# Patient Record
Sex: Female | Born: 1960
Health system: Southern US, Community
[De-identification: ages and names within clinical notes are randomized; demographics above are authoritative.]

## PROBLEM LIST (undated history)

## (undated) DIAGNOSIS — Z8742 Personal history of other diseases of the female genital tract: Secondary | ICD-10-CM

## (undated) DIAGNOSIS — D649 Anemia, unspecified: Secondary | ICD-10-CM

## (undated) HISTORY — DX: Personal history of other diseases of the female genital tract: Z87.42

## (undated) HISTORY — DX: Anemia, unspecified: D64.9

## (undated) HISTORY — PX: WISDOM TOOTH EXTRACTION: SHX21

## (undated) HISTORY — PX: TUBAL LIGATION: SHX77

---

## 1999-01-08 ENCOUNTER — Other Ambulatory Visit: Admission: RE | Admit: 1999-01-08 | Discharge: 1999-01-08 | Payer: Self-pay | Admitting: Family Medicine

## 2000-04-24 ENCOUNTER — Other Ambulatory Visit: Admission: RE | Admit: 2000-04-24 | Discharge: 2000-04-24 | Payer: Self-pay | Admitting: Family Medicine

## 2001-05-01 ENCOUNTER — Other Ambulatory Visit: Admission: RE | Admit: 2001-05-01 | Discharge: 2001-05-01 | Payer: Self-pay | Admitting: Family Medicine

## 2001-05-04 ENCOUNTER — Encounter: Payer: Self-pay | Admitting: Family Medicine

## 2001-05-04 ENCOUNTER — Encounter: Admission: RE | Admit: 2001-05-04 | Discharge: 2001-05-04 | Payer: Self-pay | Admitting: Family Medicine

## 2001-05-08 ENCOUNTER — Encounter: Payer: Self-pay | Admitting: Family Medicine

## 2001-05-08 ENCOUNTER — Encounter: Admission: RE | Admit: 2001-05-08 | Discharge: 2001-05-08 | Payer: Self-pay | Admitting: Family Medicine

## 2002-06-16 ENCOUNTER — Other Ambulatory Visit: Admission: RE | Admit: 2002-06-16 | Discharge: 2002-06-16 | Payer: Self-pay | Admitting: Family Medicine

## 2002-06-29 ENCOUNTER — Encounter: Payer: Self-pay | Admitting: Family Medicine

## 2002-06-29 ENCOUNTER — Encounter: Admission: RE | Admit: 2002-06-29 | Discharge: 2002-06-29 | Payer: Self-pay | Admitting: Family Medicine

## 2002-07-05 ENCOUNTER — Encounter: Payer: Self-pay | Admitting: Gastroenterology

## 2002-07-05 ENCOUNTER — Ambulatory Visit (HOSPITAL_COMMUNITY): Admission: RE | Admit: 2002-07-05 | Discharge: 2002-07-05 | Payer: Self-pay | Admitting: Gastroenterology

## 2003-06-29 ENCOUNTER — Other Ambulatory Visit: Admission: RE | Admit: 2003-06-29 | Discharge: 2003-06-29 | Payer: Self-pay | Admitting: Obstetrics and Gynecology

## 2004-07-02 ENCOUNTER — Other Ambulatory Visit: Admission: RE | Admit: 2004-07-02 | Discharge: 2004-07-02 | Payer: Self-pay | Admitting: Obstetrics and Gynecology

## 2005-07-02 ENCOUNTER — Other Ambulatory Visit: Admission: RE | Admit: 2005-07-02 | Discharge: 2005-07-02 | Payer: Self-pay | Admitting: Obstetrics and Gynecology

## 2008-09-16 ENCOUNTER — Encounter: Admission: RE | Admit: 2008-09-16 | Discharge: 2008-09-16 | Payer: Self-pay | Admitting: Family Medicine

## 2011-07-16 HISTORY — PX: COLONOSCOPY: SHX174

## 2012-08-24 ENCOUNTER — Encounter: Payer: Self-pay | Admitting: Obstetrics and Gynecology

## 2012-08-24 ENCOUNTER — Ambulatory Visit: Payer: 59 | Admitting: Obstetrics and Gynecology

## 2012-08-24 VITALS — BP 108/70 | Ht 60.5 in | Wt 154.0 lb

## 2012-08-24 DIAGNOSIS — Z01419 Encounter for gynecological examination (general) (routine) without abnormal findings: Secondary | ICD-10-CM

## 2012-08-24 DIAGNOSIS — Z124 Encounter for screening for malignant neoplasm of cervix: Secondary | ICD-10-CM

## 2012-08-24 NOTE — Progress Notes (Signed)
Subjective:    Mandy Sanders is a 52 y.o. female, G2P0, who presents for an annual exam. The patient reports  Menstrual cycle:   LMP: No LMP recorded. Patient is postmenopausal.             Review of Systems Pertinent items are noted in HPI. Denies pelvic pain, urinary tract symptoms, vaginitis symptoms, irregular bleeding, menopausal symptoms, change in bowel habits or rectal bleeding   Objective:    BP 108/70  Ht 5' 0.5" (1.537 m)  Wt 154 lb (69.854 kg)  BMI 29.57 kg/m2   Wt Readings from Last 1 Encounters:  08/24/12 154 lb (69.854 kg)   Body mass index is 29.57 kg/(m^2). General Appearance: Alert, no acute distress HEENT: Grossly normal Neck / Thyroid: Supple, no thyromegaly or cervical adenopathy Lungs: Clear to auscultation bilaterally Back: No CVA tenderness Breast Exam: No masses or nodes.No dimpling, nipple retraction or discharge. Cardiovascular: Regular rate and rhythm.  Gastrointestinal: Soft, non-tender, no masses or organomegaly Pelvic Exam: EGBUS-wnl, vagina-normal rugae, cervix- without lesions or tenderness, uterus appears normal size shape and consistency, adnexae-no masses or tenderness Rectovaginal: no masses and normal sphincter tone Lymphatic Exam: Non-palpable nodes in neck, clavicular,  axillary, or inguinal regions  Skin: no rashes or abnormalities Extremities: no clubbing cyanosis or edema  Neurologic: grossly normal Psychiatric: Alert and oriented     Assessment:   Routine GYN Exam   Plan:    PAP sent  RTO 1 year or prn  Angelette Ganus,ELMIRAPA-C

## 2012-08-24 NOTE — Progress Notes (Signed)
Regular Periods: no Mammogram: yes  Monthly Breast Ex.: yes Exercise: yes  Tetanus < 10 years: yes Seatbelts: yes  NI. Bladder Functn.: yes Abuse at home: no  Daily BM's: yes Stressful Work: no  Healthy Diet: yes Sigmoid-Colonoscopy: 2013  Calcium: yes Medical problems this year: none   LAST PAP:1/13  Contraception: btl  Mammogram:  1/13   nl  PCP: Altamease Oiler Redmon  PMH: no change  FMH: no change  Last Bone Scan: no  Pt is married.

## 2013-04-30 ENCOUNTER — Telehealth (HOSPITAL_COMMUNITY): Payer: Self-pay | Admitting: Family Medicine

## 2013-04-30 ENCOUNTER — Emergency Department (HOSPITAL_COMMUNITY)
Admission: EM | Admit: 2013-04-30 | Discharge: 2013-04-30 | Disposition: A | Discharge status: Home / Self Care | Payer: 59 | Source: Home / Self Care | Attending: Family Medicine | Admitting: Family Medicine

## 2013-04-30 ENCOUNTER — Ambulatory Visit (HOSPITAL_COMMUNITY)
Admit: 2013-04-30 | Discharge: 2013-04-30 | Disposition: A | Discharge status: Home / Self Care | Payer: 59 | Attending: Family Medicine | Admitting: Family Medicine

## 2013-04-30 ENCOUNTER — Encounter (HOSPITAL_COMMUNITY): Payer: Self-pay | Admitting: Emergency Medicine

## 2013-04-30 ENCOUNTER — Emergency Department (INDEPENDENT_AMBULATORY_CARE_PROVIDER_SITE_OTHER): Payer: 59

## 2013-04-30 DIAGNOSIS — M542 Cervicalgia: Secondary | ICD-10-CM | POA: Insufficient documentation

## 2013-04-30 DIAGNOSIS — M5412 Radiculopathy, cervical region: Secondary | ICD-10-CM

## 2013-04-30 DIAGNOSIS — M5030 Other cervical disc degeneration, unspecified cervical region: Secondary | ICD-10-CM

## 2013-04-30 DIAGNOSIS — M5380 Other specified dorsopathies, site unspecified: Secondary | ICD-10-CM | POA: Insufficient documentation

## 2013-04-30 MED ORDER — ONDANSETRON 4 MG PO TBDP
4.0000 mg | ORAL_TABLET | Freq: Once | ORAL | Status: AC
  Administered 2013-04-30: 4 mg via ORAL

## 2013-04-30 MED ORDER — OXYCODONE-ACETAMINOPHEN 5-325 MG PO TABS: 1.0000 | ORAL_TABLET | ORAL | Status: DC | PRN

## 2013-04-30 MED ORDER — PREDNISONE 20 MG PO TABS
ORAL_TABLET | ORAL | Status: AC
  Filled 2013-04-30: qty 3

## 2013-04-30 MED ORDER — ONDANSETRON 4 MG PO TBDP
ORAL_TABLET | ORAL | Status: AC
  Filled 2013-04-30: qty 1

## 2013-04-30 MED ORDER — PREDNISONE 20 MG PO TABS
60.0000 mg | ORAL_TABLET | Freq: Once | ORAL | Status: AC
  Administered 2013-04-30: 60 mg via ORAL

## 2013-04-30 MED ORDER — HYDROMORPHONE HCL PF 1 MG/ML IJ SOLN
INTRAMUSCULAR | Status: AC
  Filled 2013-04-30: qty 2

## 2013-04-30 MED ORDER — HYDROMORPHONE HCL 1 MG/ML IJ SOLN
2.0000 mg | Freq: Once | INTRAMUSCULAR | Status: AC
  Administered 2013-04-30: 2 mg via INTRAMUSCULAR

## 2013-04-30 MED ORDER — HYDROCODONE-ACETAMINOPHEN 5-325 MG PO TABS
1.0000 | ORAL_TABLET | Freq: Once | ORAL | Status: AC
  Administered 2013-04-30: 1 via ORAL

## 2013-04-30 MED ORDER — PREDNISONE 20 MG PO TABS: 40.0000 mg | ORAL_TABLET | Freq: Every day | ORAL | Status: DC

## 2013-04-30 MED ORDER — HYDROCODONE-ACETAMINOPHEN 5-325 MG PO TABS
ORAL_TABLET | ORAL | Status: AC
  Filled 2013-04-30: qty 1

## 2013-04-30 MED ORDER — HYDROMORPHONE HCL 1 MG/ML IJ SOLN: 1.0000 mg | Freq: Once | INTRAMUSCULAR | Status: DC

## 2013-04-30 NOTE — ED Notes (Signed)
Pt       Reports  Pain  r  Arm           X  1  Month             denys  Any  specefic  Injury  Pain  Became  Worse  Last  Pm        When  She  Arrived       She  Was  Noted  To  Be  Wearing an ace  Bandage           Family  With  Her  She  denya  ny  Chest pain  Or  Shortness  Of  Breath

## 2013-04-30 NOTE — ED Notes (Signed)
MRI results back.  I discussed the case with Dr. Newell Coral  Showing rt C6 nerve root compression and slight cord compression.  Will followup with Dr. Newell Coral Tuesday. Continue prednisone Come back or go to the emergency room if you notice worsening  weakness new numbness problems walking or bowel or bladder problems.   [name], MD 04/30/13 2011

## 2013-04-30 NOTE — ED Provider Notes (Signed)
CSN: [id number]     Arrival date & time 04/30/13  0818 History   First MD Initiated Contact with Patient 04/30/13 [phone]     No chief complaint on file.  HPI: Patient is a 52 y.o. female presenting with extremity weakness. The history is provided by the patient.  Extremity Weakness This is a new problem. The current episode started 12 to 24 hours ago. The problem occurs constantly. The problem has been gradually worsening. Nothing aggravates the symptoms. She has tried nothing for the symptoms.  Pt reports an approx 1 month h/o increasing pain to (R) lateral neck that radiates into (R) shoulder and (R) arm. She admits to intermittent numbness and tingling in her (R) hand. Also admits she has noted gradual increasing weakness to the RUE as well. Pt presents to day because over last 24 hours she has noted acutely increased pain (>10/10) to (R) arm and increased weakness to RUE. 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   Past Medical History  Diagnosis Date  . Anemia   . H/O menorrhagia    Past Surgical History  Procedure Laterality Date  . Tubal ligation    . Wisdom tooth extraction    . Colonoscopy  2013   Family History  Problem Relation Age of Onset  . Hypertension Mother   . Diabetes Mother   . Stroke Mother   . Diabetes Father   . Stroke Father   . Hypertension Sister   . Cancer Maternal Grandmother     ovarian?   History  Substance Use Topics  . Smoking status: Current Every Day Smoker  . Smokeless tobacco: Not on file     Comment: 5-7 cigarettes a day  . Alcohol Use: No   OB History   Grav Para Term Preterm Abortions TAB SAB Ect Mult Living   2         2     Review of Systems  Musculoskeletal: Positive for extremity weakness.  All other systems reviewed and are negative.    Allergies  Review of patient's allergies indicates no known allergies.  Home Medications   Current Outpatient Rx  Name  Route  Sig   Dispense  Refill  . calcium carbonate 200 MG capsule   Oral   Take 250 mg by mouth 2 (two) times daily with a meal.         . fexofenadine (ALLEGRA) 180 MG tablet   Oral   Take 180 mg by mouth daily.         . Multiple Vitamin (MULTIVITAMIN) tablet   Oral   Take 1 tablet by mouth daily.         Marland Kitchen oxyCODONE-acetaminophen (PERCOCET/ROXICET) 5-325 MG per tablet   Oral   Take 1-2 tablets by mouth every 4 (four) hours as needed for pain.   20 tablet   0   . predniSONE (DELTASONE) 20 MG tablet   Oral   Take 2 tablets (40 mg total) by mouth daily. For 7 days   14 tablet   0   . RABEprazole (ACIPHEX) 20 MG tablet   Oral   Take 20 mg by mouth daily.          BP 148/79  Pulse 81  Temp(Src) 98 F (36.7 C) (Oral)  Resp 16  SpO2 99% Physical Exam  Constitutional: She is oriented to person, place, and time. She appears well-developed and well-nourished.  HENT:  Head: Normocephalic and atraumatic.  Eyes: Conjunctivae are normal.  Neck: Neck supple. Muscular tenderness present. No spinous process tenderness present. No rigidity.    Significant TTP to (R) lateral paraspinal region that extends (R) shoulder  Cardiovascular: Normal rate.   Pulmonary/Chest: Effort normal.  Musculoskeletal:  Pt reports difficulty raising (R) arm above her head but reports some relief of pain when arm is in that position. Difficult to raise arm laterally. Strength and grip weaker on (R) vs (L). RUE strength 3/5 vs 5/5 on (L). RUE pain increases w/ all passive and active ROM.  Neurological: She is alert and oriented to person, place, and time.  Skin: Skin is warm and dry.  Psychiatric: She has a normal mood and affect.    ED Course  Procedures (including critical care time) Labs Review Labs Reviewed - No data to display Imaging Review Dg Cervical Spine 2-3 Views  04/30/2013   CLINICAL DATA:  Neck pain with right-sided radicular symptoms  EXAM: CERVICAL SPINE - 2-3 VIEW  COMPARISON:   None.  FINDINGS: Frontal, lateral, and open-mouth odontoid images were obtained. There is no fracture or spondylolisthesis. Prevertebral soft tissues and predental space regions are normal.  There is moderately severe disc space narrowing at C4-5, and C6-7. There are posterior and anterior osteophytes at C4, C5, and C6. No erosive change.  There are cervical ribs bilaterally.  IMPRESSION: Multilevel osteoarthritic change. No fracture or spondylolisthesis. Cervical ribs are present bilaterally.   Electronically Signed   By: Bretta Bang M.D.   On: 04/30/2013 09:55   Mr Cervical Spine Wo Contrast  04/30/2013   CLINICAL DATA:  Neck pain radiating into the right arm. Symptoms for 1 month.  EXAM: MRI CERVICAL SPINE WITHOUT CONTRAST  TECHNIQUE: Multiplanar, multisequence MR imaging was performed. No intravenous contrast was administered.  COMPARISON:  Plain film cervical spine 04/30/2013.  FINDINGS: There is some straightening of the normal cervical lordosis. Vertebral body height, signal and alignment are unremarkable. The craniocervical junction is normal and cervical cord signal is normal. Paraspinous structures are unremarkable.  C2-3: Negative.  C3-4: Minimal posterior bony ridging and uncovertebral disease without central canal or foraminal narrowing.  C4-5: The patient has a small disc osteophyte complex and bilateral uncovertebral disease. The central canal is mildly narrowed. Moderate to moderately severe bilateral foraminal narrowing appears worse on the right.  C5-6: The patient has a disc osteophyte complex eccentric to the right and right worse than left uncovertebral spurring. There is some deformity of the cord, more notable on the right. Uncovertebral spurring encroaches on the right C6 root. Moderate left foraminal narrowing is identified.  C6-7: There is a minimal disc bulge without central canal or foraminal narrowing.  C7-T1: Negative.  IMPRESSION: Uncovertebral spurring at C5-6, worse on the  right, results in marked encroachment on the right C6 root. Disc osteophyte complex at this level slightly deforms the cord, more so on the right.  Moderate to moderately severe bilateral foraminal narrowing at C4-5, worse on the right, due to uncovertebral disease.   Electronically Signed   By: Drusilla Kanner M.D.   On: 04/30/2013 16:12    EKG Interpretation     Ventricular Rate:    PR Interval:    QRS Duration:   QT Interval:    QTC Calculation:   R Axis:     Text Interpretation:              MDM   1. Cervical radiculopathy   2. Degenerative disc disease,  cervical    One month h/o increasing pain to (R) lateral neck and RUE that has been associated w/ increasing weakness and intermittent numbness and tingling to (R) hand. Over last 24 hrs acutely increased pain and weakness. PE concerning for acute nerve impingement/cord compression. Feel pt meets criteria for urgent MRI imaging. Discussed pt and plan w/ Dr Denyse Amass who is in agreement w/ plan. Pt scheduled for MRI cervical spine w/o CM at 1500 this afternoon. Preauthorization obtained from insurance provider. Pt given Prednisone 60 mg, Vicodin 1 tab (w/o relief) and Dilaudid 2mg  IM while in office. Pt reports some relief at time of d/c and verbalizes understanding of instructions to return to [facility] radiology at 1445 this afternoon for MRI. Rx for Percocet and Prednisone provided. Spoke w/ MRI department at [facility], instructed them to call results to Dr Denyse Amass at [facility]. Pt informed she would be contacted w/ results and instructions either this evening or early tomorrow morning. Pt agreeable w/ plan.    Leanne Chang, NP 04/30/13 2035

## 2013-05-01 ENCOUNTER — Encounter (HOSPITAL_COMMUNITY): Payer: Self-pay | Admitting: Emergency Medicine

## 2013-05-01 ENCOUNTER — Emergency Department (INDEPENDENT_AMBULATORY_CARE_PROVIDER_SITE_OTHER)
Admission: EM | Admit: 2013-05-01 | Discharge: 2013-05-01 | Disposition: A | Discharge status: Home / Self Care | Payer: 59 | Source: Home / Self Care | Attending: Family Medicine | Admitting: Family Medicine

## 2013-05-01 ENCOUNTER — Emergency Department (HOSPITAL_COMMUNITY)
Admission: EM | Admit: 2013-05-01 | Discharge: 2013-05-01 | Disposition: A | Discharge status: Home / Self Care | Payer: 59 | Source: Home / Self Care

## 2013-05-01 DIAGNOSIS — M5412 Radiculopathy, cervical region: Secondary | ICD-10-CM

## 2013-05-01 MED ORDER — OXYCODONE-ACETAMINOPHEN 10-325 MG PO TABS: 0.5000 | ORAL_TABLET | ORAL | Status: DC | PRN

## 2013-05-01 NOTE — ED Provider Notes (Signed)
Mandy Sanders is a 52 y.o. female who presents to Urgent Care today for followup right cervical radiculopathy. Patient was seen yesterday and obtained a stat MRI which showed C6 nerve for encroachment and impingement with mild cord compression. She is here today as her pain has persisted and she is used of most of her oxycodone. Additionally she needs a new work note. She has an appointment with Dr. Newell Coral on Tuesday. She is using one or 2 oxycodone every 4 hours as directed for pain. Additionally she's using prednisone which seems to be helping her strength.    Past Medical History  Diagnosis Date  . Anemia   . H/O menorrhagia    History  Substance Use Topics  . Smoking status: Current Every Day Smoker  . Smokeless tobacco: Not on file     Comment: 5-7 cigarettes a day  . Alcohol Use: No   ROS as above Medications reviewed. No current facility-administered medications for this encounter.   Current Outpatient Prescriptions  Medication Sig Dispense Refill  . calcium carbonate 200 MG capsule Take 250 mg by mouth 2 (two) times daily with a meal.      . fexofenadine (ALLEGRA) 180 MG tablet Take 180 mg by mouth daily.      . Multiple Vitamin (MULTIVITAMIN) tablet Take 1 tablet by mouth daily.      Marland Kitchen oxyCODONE-acetaminophen (PERCOCET) 10-325 MG per tablet Take 0.5-1 tablets by mouth every 4 (four) hours as needed for pain.  30 tablet  0  . predniSONE (DELTASONE) 20 MG tablet Take 2 tablets (40 mg total) by mouth daily. For 7 days  14 tablet  0  . RABEprazole (ACIPHEX) 20 MG tablet Take 20 mg by mouth daily.        Exam:  BP 131/81  Pulse 75  Temp(Src) 98.8 F (37.1 C) (Oral)  Resp 17  SpO2 100% Gen: Well NAD NECK: Nontender to spinal midline normal neck range of motion. Strength is intact bilateral upper extremities as is sensation.  No results found for this or any previous visit (from the past 24 hour(s)). Dg Cervical Spine 2-3 Views  04/30/2013   CLINICAL DATA:  Neck pain  with right-sided radicular symptoms  EXAM: CERVICAL SPINE - 2-3 VIEW  COMPARISON:  None.  FINDINGS: Frontal, lateral, and open-mouth odontoid images were obtained. There is no fracture or spondylolisthesis. Prevertebral soft tissues and predental space regions are normal.  There is moderately severe disc space narrowing at C4-5, and C6-7. There are posterior and anterior osteophytes at C4, C5, and C6. No erosive change.  There are cervical ribs bilaterally.  IMPRESSION: Multilevel osteoarthritic change. No fracture or spondylolisthesis. Cervical ribs are present bilaterally.   Electronically Signed   By: Bretta Bang M.D.   On: 04/30/2013 09:55   Mr Cervical Spine Wo Contrast  04/30/2013   CLINICAL DATA:  Neck pain radiating into the right arm. Symptoms for 1 month.  EXAM: MRI CERVICAL SPINE WITHOUT CONTRAST  TECHNIQUE: Multiplanar, multisequence MR imaging was performed. No intravenous contrast was administered.  COMPARISON:  Plain film cervical spine 04/30/2013.  FINDINGS: There is some straightening of the normal cervical lordosis. Vertebral body height, signal and alignment are unremarkable. The craniocervical junction is normal and cervical cord signal is normal. Paraspinous structures are unremarkable.  C2-3: Negative.  C3-4: Minimal posterior bony ridging and uncovertebral disease without central canal or foraminal narrowing.  C4-5: The patient has a small disc osteophyte complex and bilateral uncovertebral disease. The central canal is  mildly narrowed. Moderate to moderately severe bilateral foraminal narrowing appears worse on the right.  C5-6: The patient has a disc osteophyte complex eccentric to the right and right worse than left uncovertebral spurring. There is some deformity of the cord, more notable on the right. Uncovertebral spurring encroaches on the right C6 root. Moderate left foraminal narrowing is identified.  C6-7: There is a minimal disc bulge without central canal or foraminal  narrowing.  C7-T1: Negative.  IMPRESSION: Uncovertebral spurring at C5-6, worse on the right, results in marked encroachment on the right C6 root. Disc osteophyte complex at this level slightly deforms the cord, more so on the right.  Moderate to moderately severe bilateral foraminal narrowing at C4-5, worse on the right, due to uncovertebral disease.   Electronically Signed   By: Drusilla Kanner M.D.   On: 04/30/2013 16:12    Assessment and Plan: 52 y.o. female with right cervical radiculopathy. Pain somewhat worsened however strength is somewhat improved with prednisone.  Patient will followup with neurosurgery on Tuesday We'll increase oxycodone 10/325.  Continue prednisone Discussed warning signs or symptoms. Please see discharge instructions. Patient expresses understanding.      [name], MD 05/01/13 [phone]

## 2013-05-01 NOTE — ED Provider Notes (Signed)
MRI results showed right C6 nerve impingement with mild cord compression. These findings are consistent with patient's physical exam and symptoms. I discussed with Dr. Newell Coral who will see the patient on Tuesday.  Please see phone note for further details.   Medical screening examination/treatment/procedure(s) were performed by a resident physician or non-physician practitioner and as the supervising physician I was immediately available for consultation/collaboration.  Clementeen Graham, MD   [name], MD 05/01/13 [phone]

## 2013-05-01 NOTE — ED Notes (Signed)
Pt here for f/u still feels a lot of pain. Wanted to discuss results and receive a note. Pt is alert and oriented.

## 2013-12-03 ENCOUNTER — Other Ambulatory Visit (HOSPITAL_COMMUNITY)
Admission: RE | Admit: 2013-12-03 | Discharge: 2013-12-03 | Disposition: A | Discharge status: Home / Self Care | Payer: 59 | Source: Ambulatory Visit | Attending: Family Medicine | Admitting: Family Medicine

## 2013-12-03 ENCOUNTER — Other Ambulatory Visit: Payer: Self-pay | Admitting: Physician Assistant

## 2013-12-03 DIAGNOSIS — Z124 Encounter for screening for malignant neoplasm of cervix: Secondary | ICD-10-CM | POA: Insufficient documentation

## 2014-05-16 ENCOUNTER — Encounter (HOSPITAL_COMMUNITY): Payer: Self-pay | Admitting: Emergency Medicine

## 2015-05-27 IMAGING — CR DG CERVICAL SPINE 2 OR 3 VIEWS
5 series · 5 of 5 positions shown · non-contrast
Comparison: None.

CLINICAL DATA: Neck pain with right-sided radicular symptoms

EXAM:
CERVICAL SPINE - 2-3 VIEW

[view not recorded (1 of 5)]
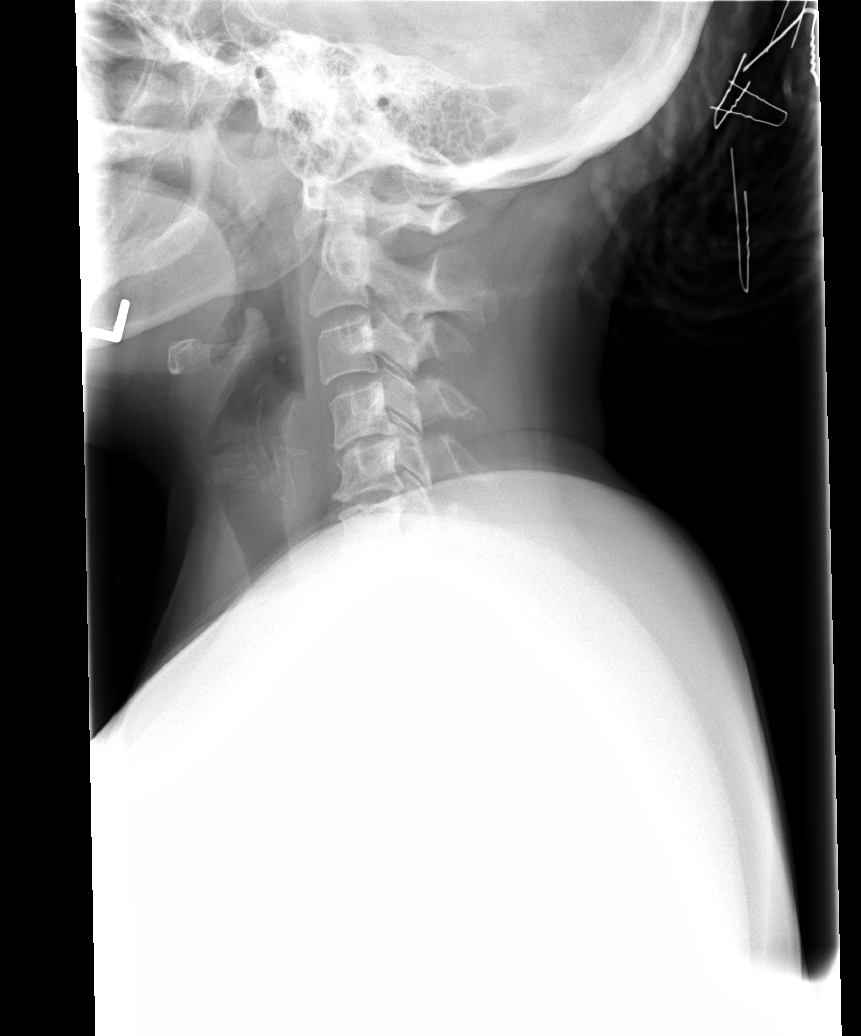

[view not recorded (2 of 5)]
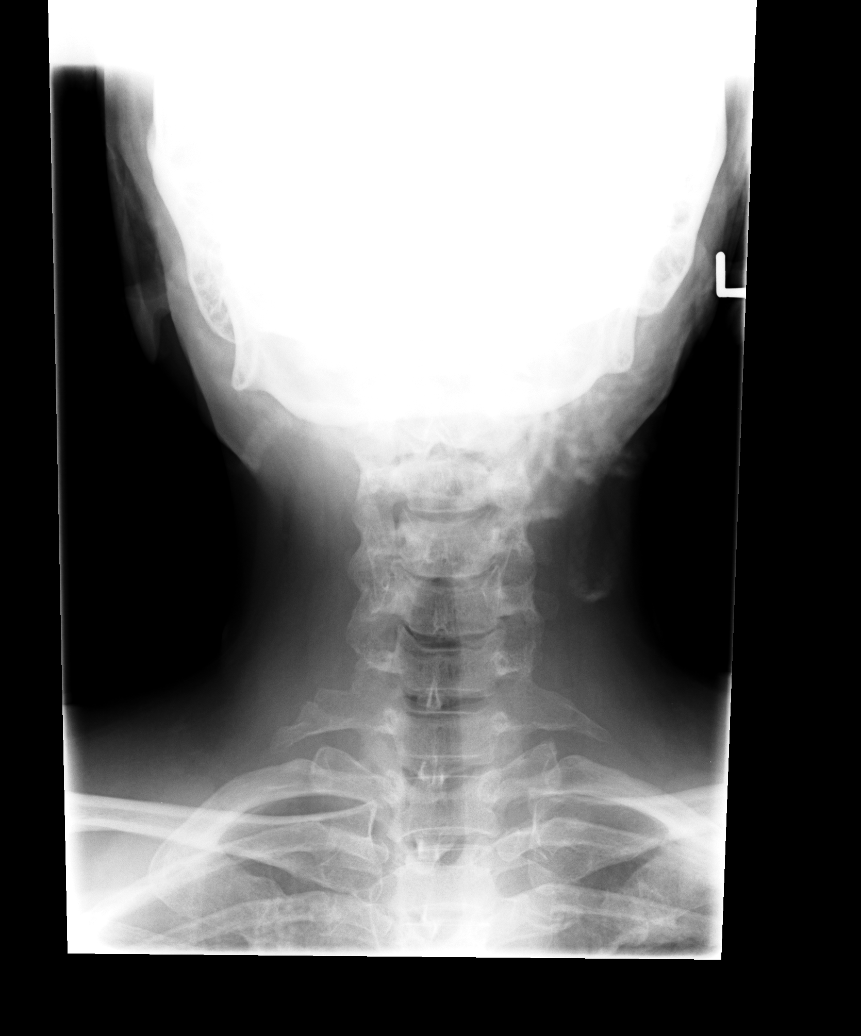

[view not recorded (3 of 5)]
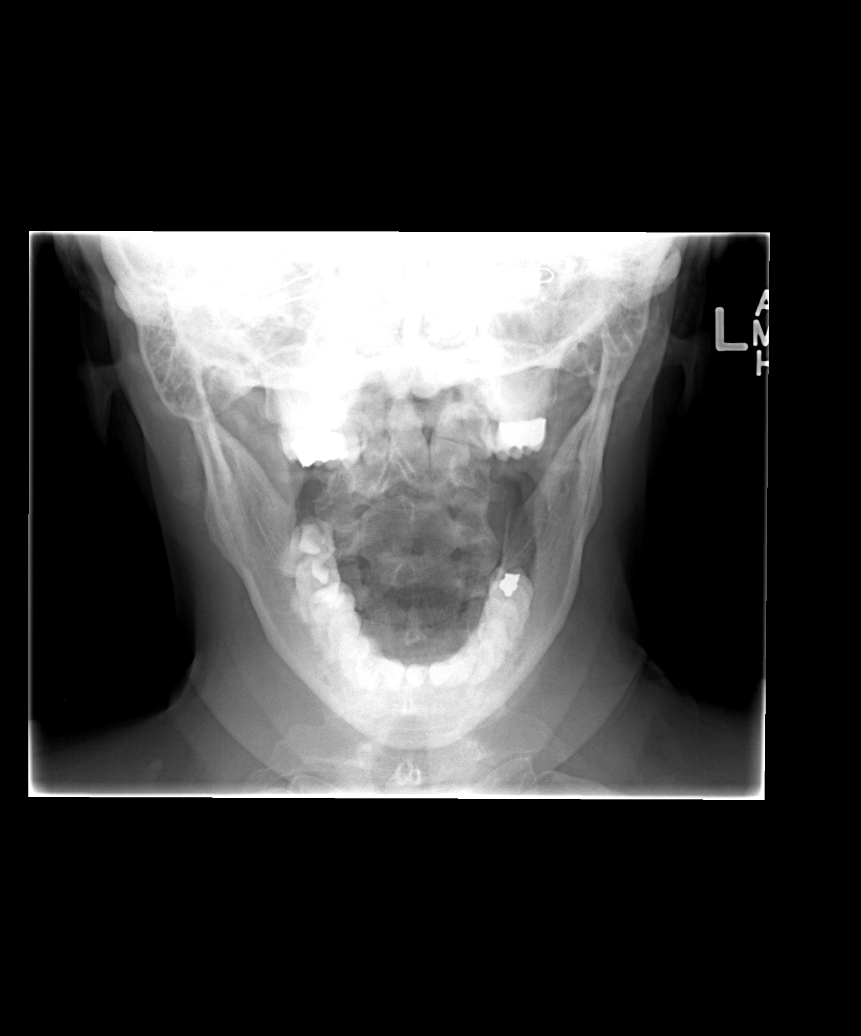

[view not recorded (4 of 5)]
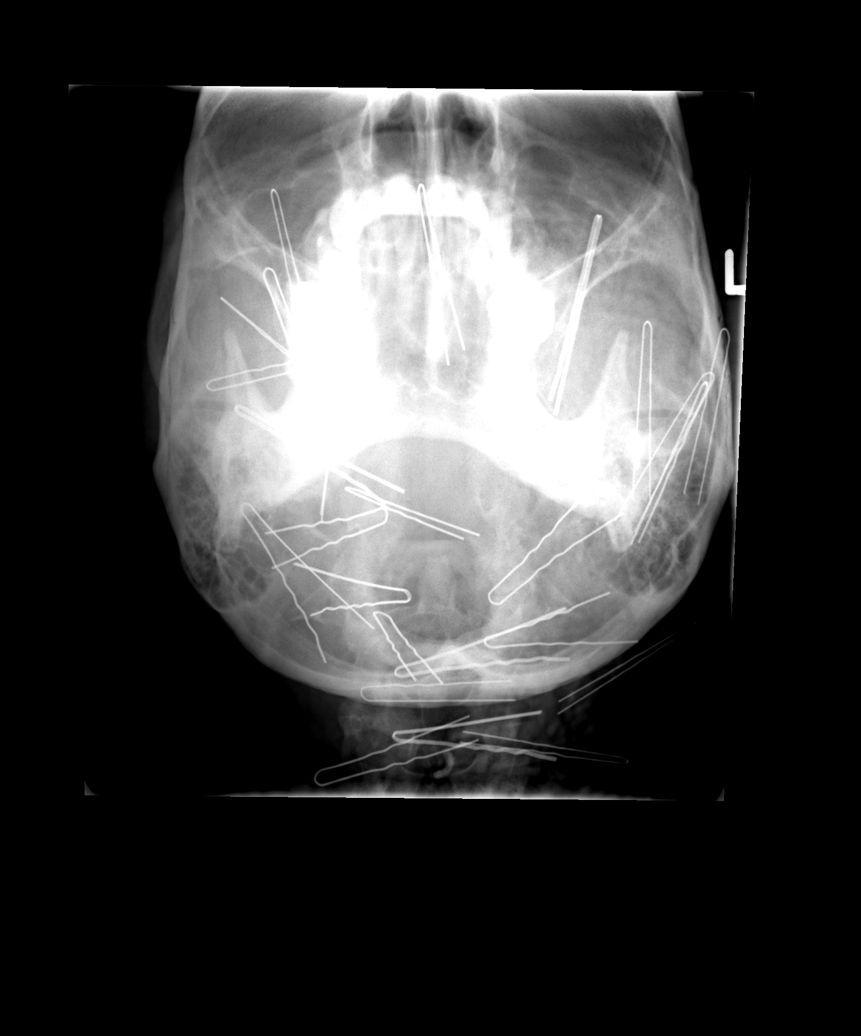

[view not recorded (5 of 5)]
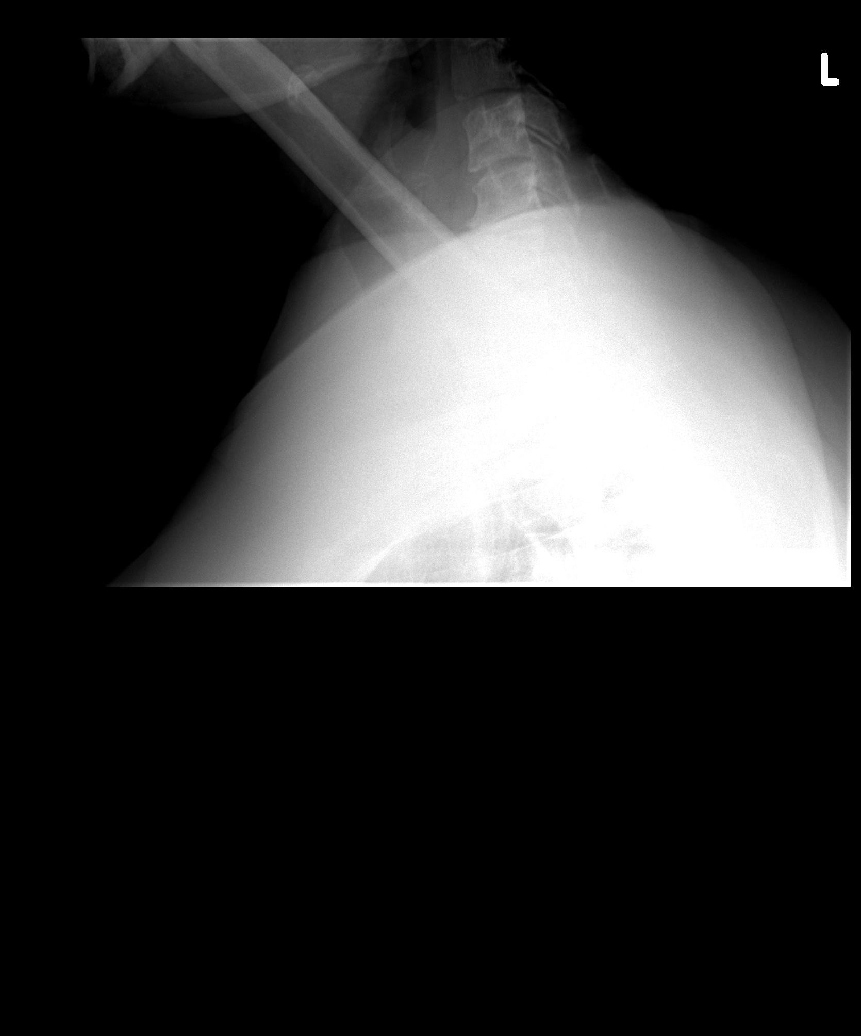

[5 of 5 positions shown; findings below may reference images not displayed]

FINDINGS: Frontal, lateral, and open-mouth odontoid images were obtained.
There is no fracture or spondylolisthesis. Prevertebral soft tissues
and predental space regions are normal.

There is moderately severe disc space narrowing at C4-5, and C6-7.
There are posterior and anterior osteophytes at C4, C5, and C6. No
erosive change.

There are cervical ribs bilaterally.
IMPRESSION: Multilevel osteoarthritic change. No fracture or spondylolisthesis.
Cervical ribs are present bilaterally.

## 2016-08-26 DIAGNOSIS — Z1231 Encounter for screening mammogram for malignant neoplasm of breast: Secondary | ICD-10-CM | POA: Diagnosis not present

## 2016-09-19 DIAGNOSIS — E7800 Pure hypercholesterolemia, unspecified: Secondary | ICD-10-CM | POA: Diagnosis not present

## 2016-12-06 DIAGNOSIS — B370 Candidal stomatitis: Secondary | ICD-10-CM | POA: Diagnosis not present

## 2016-12-10 DIAGNOSIS — H40013 Open angle with borderline findings, low risk, bilateral: Secondary | ICD-10-CM | POA: Diagnosis not present

## 2016-12-26 DIAGNOSIS — E7800 Pure hypercholesterolemia, unspecified: Secondary | ICD-10-CM | POA: Diagnosis not present

## 2016-12-26 DIAGNOSIS — K219 Gastro-esophageal reflux disease without esophagitis: Secondary | ICD-10-CM | POA: Diagnosis not present

## 2016-12-26 DIAGNOSIS — E1165 Type 2 diabetes mellitus with hyperglycemia: Secondary | ICD-10-CM | POA: Diagnosis not present

## 2016-12-26 DIAGNOSIS — Z0000 Encounter for general adult medical examination without abnormal findings: Secondary | ICD-10-CM | POA: Diagnosis not present

## 2017-01-01 DIAGNOSIS — Z01419 Encounter for gynecological examination (general) (routine) without abnormal findings: Secondary | ICD-10-CM | POA: Diagnosis not present

## 2017-01-01 DIAGNOSIS — Z124 Encounter for screening for malignant neoplasm of cervix: Secondary | ICD-10-CM | POA: Diagnosis not present

## 2017-05-20 ENCOUNTER — Ambulatory Visit
Admission: RE | Admit: 2017-05-20 | Discharge: 2017-05-20 | Disposition: A | Discharge status: Home / Self Care | Payer: 59 | Source: Ambulatory Visit | Attending: Physician Assistant | Admitting: Physician Assistant

## 2017-05-20 ENCOUNTER — Other Ambulatory Visit: Payer: Self-pay | Admitting: Physician Assistant

## 2017-05-20 DIAGNOSIS — R52 Pain, unspecified: Secondary | ICD-10-CM

## 2017-05-20 DIAGNOSIS — M545 Low back pain: Secondary | ICD-10-CM | POA: Diagnosis not present

## 2017-08-05 ENCOUNTER — Ambulatory Visit
Admission: RE | Admit: 2017-08-05 | Discharge: 2017-08-05 | Disposition: A | Discharge status: Home / Self Care | Payer: 59 | Source: Ambulatory Visit | Attending: Physician Assistant | Admitting: Physician Assistant

## 2017-08-05 ENCOUNTER — Other Ambulatory Visit: Payer: Self-pay | Admitting: Physician Assistant

## 2017-08-05 DIAGNOSIS — M7989 Other specified soft tissue disorders: Secondary | ICD-10-CM | POA: Diagnosis not present

## 2017-08-05 DIAGNOSIS — E119 Type 2 diabetes mellitus without complications: Secondary | ICD-10-CM | POA: Diagnosis not present

## 2017-08-05 DIAGNOSIS — R52 Pain, unspecified: Secondary | ICD-10-CM

## 2017-08-05 DIAGNOSIS — K219 Gastro-esophageal reflux disease without esophagitis: Secondary | ICD-10-CM | POA: Diagnosis not present

## 2017-08-05 DIAGNOSIS — E7800 Pure hypercholesterolemia, unspecified: Secondary | ICD-10-CM | POA: Diagnosis not present

## 2017-08-20 DIAGNOSIS — M65331 Trigger finger, right middle finger: Secondary | ICD-10-CM | POA: Diagnosis not present

## 2017-08-20 DIAGNOSIS — M542 Cervicalgia: Secondary | ICD-10-CM | POA: Diagnosis not present

## 2017-08-20 DIAGNOSIS — G5603 Carpal tunnel syndrome, bilateral upper limbs: Secondary | ICD-10-CM | POA: Diagnosis not present

## 2017-09-09 DIAGNOSIS — Z1231 Encounter for screening mammogram for malignant neoplasm of breast: Secondary | ICD-10-CM | POA: Diagnosis not present

## 2017-09-09 DIAGNOSIS — M810 Age-related osteoporosis without current pathological fracture: Secondary | ICD-10-CM | POA: Diagnosis not present

## 2017-09-16 DIAGNOSIS — G5603 Carpal tunnel syndrome, bilateral upper limbs: Secondary | ICD-10-CM | POA: Diagnosis not present

## 2017-09-16 DIAGNOSIS — M65331 Trigger finger, right middle finger: Secondary | ICD-10-CM | POA: Diagnosis not present

## 2017-09-16 DIAGNOSIS — M5412 Radiculopathy, cervical region: Secondary | ICD-10-CM | POA: Diagnosis not present

## 2017-09-16 DIAGNOSIS — G8929 Other chronic pain: Secondary | ICD-10-CM | POA: Diagnosis not present

## 2017-11-03 DIAGNOSIS — E7800 Pure hypercholesterolemia, unspecified: Secondary | ICD-10-CM | POA: Diagnosis not present

## 2017-11-19 DIAGNOSIS — G4733 Obstructive sleep apnea (adult) (pediatric): Secondary | ICD-10-CM | POA: Diagnosis not present

## 2017-11-20 DIAGNOSIS — G4733 Obstructive sleep apnea (adult) (pediatric): Secondary | ICD-10-CM | POA: Diagnosis not present

## 2017-12-30 DIAGNOSIS — E7800 Pure hypercholesterolemia, unspecified: Secondary | ICD-10-CM | POA: Diagnosis not present

## 2017-12-30 DIAGNOSIS — E1169 Type 2 diabetes mellitus with other specified complication: Secondary | ICD-10-CM | POA: Diagnosis not present

## 2017-12-30 DIAGNOSIS — J309 Allergic rhinitis, unspecified: Secondary | ICD-10-CM | POA: Diagnosis not present

## 2017-12-30 DIAGNOSIS — Z0000 Encounter for general adult medical examination without abnormal findings: Secondary | ICD-10-CM | POA: Diagnosis not present

## 2017-12-30 DIAGNOSIS — K219 Gastro-esophageal reflux disease without esophagitis: Secondary | ICD-10-CM | POA: Diagnosis not present

## 2018-01-06 DIAGNOSIS — M810 Age-related osteoporosis without current pathological fracture: Secondary | ICD-10-CM | POA: Diagnosis not present

## 2018-01-06 DIAGNOSIS — Z01419 Encounter for gynecological examination (general) (routine) without abnormal findings: Secondary | ICD-10-CM | POA: Diagnosis not present

## 2018-01-06 DIAGNOSIS — Z124 Encounter for screening for malignant neoplasm of cervix: Secondary | ICD-10-CM | POA: Diagnosis not present

## 2018-01-22 DIAGNOSIS — Z86010 Personal history of colonic polyps: Secondary | ICD-10-CM | POA: Diagnosis not present

## 2018-01-22 DIAGNOSIS — K219 Gastro-esophageal reflux disease without esophagitis: Secondary | ICD-10-CM | POA: Diagnosis not present

## 2018-01-22 DIAGNOSIS — Z1211 Encounter for screening for malignant neoplasm of colon: Secondary | ICD-10-CM | POA: Diagnosis not present

## 2018-02-07 ENCOUNTER — Encounter (HOSPITAL_COMMUNITY): Payer: Self-pay

## 2018-02-07 ENCOUNTER — Ambulatory Visit (HOSPITAL_COMMUNITY)
Admission: EM | Admit: 2018-02-07 | Discharge: 2018-02-07 | Disposition: A | Discharge status: Home / Self Care | Payer: 59 | Source: Home / Self Care | Attending: Internal Medicine | Admitting: Internal Medicine

## 2018-02-07 ENCOUNTER — Ambulatory Visit (INDEPENDENT_AMBULATORY_CARE_PROVIDER_SITE_OTHER): Payer: 59

## 2018-02-07 ENCOUNTER — Other Ambulatory Visit: Payer: Self-pay

## 2018-02-07 ENCOUNTER — Emergency Department (HOSPITAL_COMMUNITY): Payer: 59

## 2018-02-07 ENCOUNTER — Encounter (HOSPITAL_COMMUNITY): Payer: Self-pay | Admitting: Emergency Medicine

## 2018-02-07 ENCOUNTER — Emergency Department (HOSPITAL_COMMUNITY)
Admission: EM | Admit: 2018-02-07 | Discharge: 2018-02-07 | Disposition: A | Discharge status: Home / Self Care | Payer: 59 | Attending: Emergency Medicine | Admitting: Emergency Medicine

## 2018-02-07 DIAGNOSIS — R1084 Generalized abdominal pain: Secondary | ICD-10-CM | POA: Insufficient documentation

## 2018-02-07 DIAGNOSIS — R197 Diarrhea, unspecified: Secondary | ICD-10-CM | POA: Diagnosis not present

## 2018-02-07 DIAGNOSIS — K529 Noninfective gastroenteritis and colitis, unspecified: Secondary | ICD-10-CM | POA: Insufficient documentation

## 2018-02-07 DIAGNOSIS — K56609 Unspecified intestinal obstruction, unspecified as to partial versus complete obstruction: Secondary | ICD-10-CM

## 2018-02-07 DIAGNOSIS — R1110 Vomiting, unspecified: Secondary | ICD-10-CM | POA: Diagnosis not present

## 2018-02-07 DIAGNOSIS — R109 Unspecified abdominal pain: Secondary | ICD-10-CM | POA: Diagnosis not present

## 2018-02-07 DIAGNOSIS — Z79899 Other long term (current) drug therapy: Secondary | ICD-10-CM | POA: Insufficient documentation

## 2018-02-07 DIAGNOSIS — F17210 Nicotine dependence, cigarettes, uncomplicated: Secondary | ICD-10-CM | POA: Diagnosis not present

## 2018-02-07 DIAGNOSIS — R112 Nausea with vomiting, unspecified: Secondary | ICD-10-CM | POA: Diagnosis present

## 2018-02-07 DIAGNOSIS — R110 Nausea: Secondary | ICD-10-CM | POA: Diagnosis not present

## 2018-02-07 LAB — URINALYSIS, ROUTINE W REFLEX MICROSCOPIC
Bacteria, UA: NONE SEEN
Bilirubin Urine: NEGATIVE
GLUCOSE, UA: NEGATIVE mg/dL
Ketones, ur: 5 mg/dL — AB
Leukocytes, UA: NEGATIVE
Nitrite: NEGATIVE
PROTEIN: NEGATIVE mg/dL
Specific Gravity, Urine: 1.028 (ref 1.005–1.030)
pH: 5 (ref 5.0–8.0)

## 2018-02-07 LAB — COMPREHENSIVE METABOLIC PANEL
ALBUMIN: 3.8 g/dL (ref 3.5–5.0)
ALT: 14 U/L (ref 0–44)
AST: 18 U/L (ref 15–41)
Alkaline Phosphatase: 61 U/L (ref 38–126)
Anion gap: 10 (ref 5–15)
BILIRUBIN TOTAL: 0.6 mg/dL (ref 0.3–1.2)
BUN: 12 mg/dL (ref 6–20)
CALCIUM: 8.7 mg/dL — AB (ref 8.9–10.3)
CO2: 24 mmol/L (ref 22–32)
Chloride: 105 mmol/L (ref 98–111)
Creatinine, Ser: 0.92 mg/dL (ref 0.44–1.00)
GFR calc Af Amer: >60 mL/min (ref >60)
GFR calc non Af Amer: >60 mL/min (ref >60)
GLUCOSE: 103 mg/dL — AB (ref 70–99)
POTASSIUM: 4.1 mmol/L (ref 3.5–5.1)
Sodium: 139 mmol/L (ref 135–145)
Total Protein: 6.3 g/dL — ABNORMAL LOW (ref 6.5–8.1)

## 2018-02-07 LAB — CBC
HCT: 45.3 % (ref 36.0–46.0)
Hemoglobin: 14.2 g/dL (ref 12.0–15.0)
MCH: 30.2 pg (ref 26.0–34.0)
MCHC: 31.3 g/dL (ref 30.0–36.0)
MCV: 96.4 fL (ref 78.0–100.0)
Platelets: 280 10*3/uL (ref 150–400)
RBC: 4.7 MIL/uL (ref 3.87–5.11)
RDW: 13.2 % (ref 11.5–15.5)
WBC: 13.6 10*3/uL — ABNORMAL HIGH (ref 4.0–10.5)

## 2018-02-07 LAB — POCT URINALYSIS DIP (DEVICE)
Bilirubin Urine: NEGATIVE
GLUCOSE, UA: NEGATIVE mg/dL
KETONES UR: NEGATIVE mg/dL
LEUKOCYTES UA: NEGATIVE
Nitrite: NEGATIVE
Protein, ur: NEGATIVE mg/dL
Urobilinogen, UA: 0.2 mg/dL (ref 0.0–1.0)
pH: 5.5 (ref 5.0–8.0)

## 2018-02-07 LAB — POCT I-STAT, CHEM 8
BUN: 14 mg/dL (ref 6–20)
CREATININE: 0.9 mg/dL (ref 0.44–1.00)
Calcium, Ion: 1.15 mmol/L (ref 1.15–1.40)
Chloride: 104 mmol/L (ref 98–111)
Glucose, Bld: 101 mg/dL — ABNORMAL HIGH (ref 70–99)
HEMATOCRIT: 43 % (ref 36.0–46.0)
Hemoglobin: 14.6 g/dL (ref 12.0–15.0)
Potassium: 3.8 mmol/L (ref 3.5–5.1)
Sodium: 140 mmol/L (ref 135–145)
TCO2: 27 mmol/L (ref 22–32)

## 2018-02-07 LAB — LIPASE, BLOOD: Lipase: 26 U/L (ref 11–51)

## 2018-02-07 MED ORDER — SODIUM CHLORIDE 0.9 % IV SOLN
INTRAVENOUS | Status: DC
  Administered 2018-02-07: 17:00:00 via INTRAVENOUS

## 2018-02-07 MED ORDER — HYDROCODONE-ACETAMINOPHEN 5-325 MG PO TABS: 1.0000 | ORAL_TABLET | Freq: Four times a day (QID) | ORAL | 0 refills | Status: AC | PRN

## 2018-02-07 MED ORDER — KETOROLAC TROMETHAMINE 60 MG/2ML IM SOLN: 60.0000 mg | Freq: Once | INTRAMUSCULAR | Status: DC

## 2018-02-07 MED ORDER — SODIUM CHLORIDE 0.9 % IV BOLUS
1000.0000 mL | Freq: Once | INTRAVENOUS | Status: AC
  Administered 2018-02-07: 1000 mL via INTRAVENOUS

## 2018-02-07 MED ORDER — MORPHINE SULFATE (PF) 4 MG/ML IV SOLN
4.0000 mg | Freq: Once | INTRAVENOUS | Status: AC
  Administered 2018-02-07: 4 mg via INTRAVENOUS
  Filled 2018-02-07: qty 1

## 2018-02-07 MED ORDER — ONDANSETRON HCL 4 MG/2ML IJ SOLN
4.0000 mg | Freq: Once | INTRAMUSCULAR | Status: AC
  Administered 2018-02-07: 4 mg via INTRAVENOUS
  Filled 2018-02-07: qty 2

## 2018-02-07 MED ORDER — ONDANSETRON HCL 4 MG PO TABS: 4.0000 mg | ORAL_TABLET | Freq: Four times a day (QID) | ORAL | 0 refills | Status: AC

## 2018-02-07 MED ORDER — KETOROLAC TROMETHAMINE 60 MG/2ML IM SOLN
INTRAMUSCULAR | Status: AC
  Filled 2018-02-07: qty 2

## 2018-02-07 MED ORDER — IOHEXOL 300 MG/ML  SOLN
100.0000 mL | Freq: Once | INTRAMUSCULAR | Status: AC | PRN
  Administered 2018-02-07: 100 mL via INTRAVENOUS

## 2018-02-07 NOTE — ED Triage Notes (Signed)
Abdominal pain and back pain that started Thursday evening.  Cramping abdominal pain, vomiting and diarrhea

## 2018-02-07 NOTE — ED Provider Notes (Signed)
[place]    CSN: [id number] Arrival date & time: 02/07/18  1254  History   Chief Complaint Chief Complaint  Patient presents with  . Abdominal Pain    HPI Mandy Sanders is a 57 y.o. female.   Fairly healthy 57 y.o. Female, ate at [facility] on Thursday and developed generalized abdominal pain with nausea, vomiting and diarrhea the following day and continues to have diarrhea and abdominal pain but vomiting resolved today. She also endorses left flank pain. Reports pain to be sharp. Denies any alleviating or aggravating factors. Denies fever. Denies blood in stool. Appetite is poor. She is able to keep fluid down. Denies any urinary symptoms.      Past Medical History:  Diagnosis Date  . Anemia   . H/O menorrhagia     There are no active problems to display for this patient.   Past Surgical History:  Procedure Laterality Date  . COLONOSCOPY  2013  . TUBAL LIGATION    . WISDOM TOOTH EXTRACTION      OB History    Gravida  2   Para      Term      Preterm      AB      Living  2     SAB      TAB      Ectopic      Multiple      Live Births               Home Medications    Prior to Admission medications   Medication Sig Start Date End Date Taking? Authorizing Provider  calcium carbonate 200 MG capsule Take 250 mg by mouth 2 (two) times daily with a meal.    [provider]  fexofenadine (ALLEGRA) 180 MG tablet Take 180 mg by mouth daily.    [provider]  Multiple Vitamin (MULTIVITAMIN) tablet Take 1 tablet by mouth daily.    [provider]  oxyCODONE-acetaminophen (PERCOCET) 10-325 MG per tablet Take 0.5-1 tablets by mouth every 4 (four) hours as needed for pain. 05/01/13   Gregor Hams, MD  predniSONE (DELTASONE) 20 MG tablet Take 2 tablets (40 mg total) by mouth daily. For 7 days 04/30/13   Schorr, Rhetta Mura, NP  RABEprazole (ACIPHEX) 20 MG tablet Take 20 mg by mouth daily.    [provider]    Family History Family History  Problem Relation Age of Onset  . Hypertension Mother   . Diabetes Mother   . Stroke Mother   . Diabetes Father   . Stroke Father   . Cancer Maternal Grandmother        ovarian?  Marland Kitchen Hypertension Sister     Social History Social History   Tobacco Use  . Smoking status: Current Every Day Smoker  . Tobacco comment: 5-7 cigarettes a day  Substance Use Topics  . Alcohol use: No  . Drug use: No     Allergies   Patient has no known allergies.   Review of Systems Review of Systems  Constitutional:       As stated in the HPI     Physical Exam Triage Vital Signs ED Triage Vitals  Enc Vitals Group     BP 02/07/18 1308 140/80     Pulse Rate 02/07/18 1308 76     Resp 02/07/18 1308 18     Temp 02/07/18 1308 98.2 F (36.8 C)     Temp Source  02/07/18 1308 Oral     SpO2 02/07/18 1308 97 %     Weight --      Height --      Head Circumference --      Peak Flow --      Pain Score 02/07/18 1305 8     Pain Loc --      Pain Edu? --      Excl. in [place]? --    No data found.  Updated Vital Signs BP 140/80 (BP Location: Left Arm)   Pulse 76   Temp 98.2 F (36.8 C) (Oral)   Resp 18   SpO2 97%   Physical Exam  Constitutional: She appears well-developed and well-nourished.  Non-toxic appearance. She does not appear ill. No distress.  Unable to stand or sit still  Cardiovascular: Normal rate and normal heart sounds.  No murmur heard. Pulmonary/Chest: Effort normal and breath sounds normal. She has no wheezes.  Abdominal: Soft. Normal appearance and bowel sounds are normal. There is tenderness (generalized).  Genitourinary:  Genitourinary Comments: Positive left CVA tenderness  Skin: Skin is warm and dry.  Nursing note and vitals reviewed.   UC Treatments / Results  Labs (all labs ordered are listed, but only abnormal results are displayed) Labs Reviewed  POCT URINALYSIS DIP (DEVICE) - Abnormal; Notable for the following  components:      Result Value   Hgb urine dipstick TRACE (*)    All other components within normal limits  POCT I-STAT, CHEM 8 - Abnormal; Notable for the following components:   Glucose, Bld 101 (*)    All other components within normal limits    EKG None  Radiology Dg Abdomen 1 View  Result Date: 02/07/2018 CLINICAL DATA:  Generalized abdominal pain, nausea, vomiting and diarrhea. Concern for renal stone. EXAM: ABDOMEN - 1 VIEW COMPARISON:  None. FINDINGS: No radiopaque nephrolithiasis. Stable calcified small venous phleboliths in the left deep pelvis. Mildly dilated small bowel loops throughout the central abdomen up to 3.2 cm diameter. No evidence of pneumatosis or pneumoperitoneum on these supine views. IMPRESSION: No radiopaque nephrolithiasis. Mildly dilated small bowel loops throughout the central abdomen, cannot exclude partial mid to distal small bowel obstruction. CT abdomen/pelvis with oral and IV contrast may be obtained for further evaluation as clinically warranted. These results will be called to the ordering clinician or representative by the Radiology Department at the imaging location. Electronically Signed   By: Ilona Sorrel M.D.   On: 02/07/2018 14:10    Procedures Procedures (including critical care time)  Medications Ordered in UC Medications  ketorolac (TORADOL) injection 60 mg (has no administration in time range)    Initial Impression / Assessment and Plan / UC Course  I have reviewed the triage vital signs and the nursing notes.  Pertinent labs & imaging results that were available during my care of the patient were reviewed by me and considered in my medical decision making (see chart for details).  Final Clinical Impressions(s) / UC Diagnoses   Symptoms are fairly classic for viral gastroenteritis but her presentation is concerning for renal stone. Her UA has trace of hemoglobin. Will order KUB. Will check chem-8. Meanwhile will treat pain with Toradol 60  mg IM.   Final diagnoses:  Small bowel obstruction ([place])   Please see xray result above. Concerning for Small Bowel Obstruction, CT scan is recommended. Patient informed of the result. Patient sent to ER for further evaluation.   Discharge Instructions   None  ED Prescriptions    None     Controlled Substance Prescriptions [place] Controlled Substance Registry consulted? Not Applicable   Barry Dienes, NP 02/07/18 1425

## 2018-02-07 NOTE — Discharge Instructions (Signed)
Make sure you are drinking plenty of fluids and eating a bland diet.  Get plenty of rest.  Return to the emergency department if you get high fevers, blood in your stool or severe pain that is not improving.

## 2018-02-07 NOTE — ED Notes (Signed)
Pt and family understood dc material. NAD noted 

## 2018-02-07 NOTE — ED Triage Notes (Signed)
Pt sent from u/c.  abd xray possible mid to distal small bowel obstruction.  Needs CT.    Onset 2 days ago abd pain, vomiting x 1- last vomited yesterday morning, diarrhea x 3 today - loose.

## 2018-02-07 NOTE — ED Notes (Signed)
Patient up to bathroom without issue.

## 2018-02-07 NOTE — ED Provider Notes (Signed)
[facility] EMERGENCY DEPARTMENT Provider Note   CSN: [id number] Arrival date & time: 02/07/18  1430     History   Chief Complaint Chief Complaint  Patient presents with  . Abdominal Pain    HPI Mandy Sanders is a 57 y.o. female.  Patient is a 57 year old female who is relatively healthy presenting with 2 days of nausea, vomiting, diarrhea and diffuse abdominal tenderness.  The pain in her abdomen was not improving and she went to urgent care today.  They did a plain abdominal series which was concerning for possible obstruction.  She had a normal UA there and a CBC with mild leukocytosis.  She was sent here for further evaluation.  Patient states that she ate something at [facility] prior to starting to feel bad.  She had multiple episodes of vomiting and diarrhea on Thursday early in the morning into Friday which has gotten somewhat better but is still having several episodes of diarrhea per day and had an episode of vomiting around 2:00 this afternoon after trying to eat something.  She denies fever or blood in her stool.  She denies any urinary symptoms.  She has not recently been on any antibiotics or had any travel outside of the [place].  No known sick contacts.  The history is provided by the patient.  Abdominal Pain   This is a new problem. The current episode started 2 days ago. The problem occurs constantly. The problem has not changed since onset.The pain is associated with suspicious food intake. The pain is located in the generalized abdominal region. The quality of the pain is aching, cramping, colicky and throbbing. The pain is at a severity of 8/10. The pain is severe. Associated symptoms include anorexia, diarrhea, nausea and vomiting. Pertinent negatives include fever, constipation, dysuria, frequency and hematuria. The symptoms are aggravated by eating. Nothing relieves the symptoms. Past workup comments: no prior hx of same. Her past medical history  does not include gallstones, ulcerative colitis or Crohn's disease.    Past Medical History:  Diagnosis Date  . Anemia   . H/O menorrhagia     There are no active problems to display for this patient.   Past Surgical History:  Procedure Laterality Date  . COLONOSCOPY  2013  . TUBAL LIGATION    . WISDOM TOOTH EXTRACTION       OB History    Gravida  2   Para      Term      Preterm      AB      Living  2     SAB      TAB      Ectopic      Multiple      Live Births               Home Medications    Prior to Admission medications   Medication Sig Start Date End Date Taking? Authorizing Provider  albuterol (PROVENTIL HFA;VENTOLIN HFA) 108 (90 Base) MCG/ACT inhaler Inhale 2 puffs into the lungs every 4 (four) hours as needed for wheezing or shortness of breath.   Yes [provider]  Fexofenadine HCl (ALLEGRA PO) Take 1 tablet by mouth daily as needed (seasonal allergies).   Yes [provider]  hydrOXYzine (ATARAX/VISTARIL) 25 MG tablet Take 25 mg by mouth 3 (three) times daily as needed for itching.  12/30/17  Yes [provider]  ibandronate (BONIVA) 150 MG tablet Take 150 mg by  mouth every 30 (thirty) days. On or about the 18th of each month 01/28/18  Yes [provider]  ibuprofen (ADVIL,MOTRIN) 200 MG tablet Take 400 mg by mouth every 6 (six) hours as needed for headache (pain).   Yes [provider]  KRILL OIL PO Take 1 capsule by mouth daily.   Yes [provider]  Multiple Vitamin (MULTIVITAMIN WITH MINERALS) TABS tablet Take 1 tablet by mouth daily.   Yes [provider]  RABEprazole (ACIPHEX) 20 MG tablet Take 20 mg by mouth daily as needed (acid reflux).    Yes [provider]  rosuvastatin (CRESTOR) 10 MG tablet Take 10 mg by mouth every evening. 01/27/18  Yes [provider]  triamcinolone cream (KENALOG) 0.1 % Apply 1 application topically at bedtime as needed (eczema).   12/25/17  Yes [provider]  oxyCODONE-acetaminophen (PERCOCET) 10-325 MG per tablet Take 0.5-1 tablets by mouth every 4 (four) hours as needed for pain. Patient not taking: Reported on 02/07/2018 05/01/13   Gregor Hams, MD    Family History Family History  Problem Relation Age of Onset  . Hypertension Mother   . Diabetes Mother   . Stroke Mother   . Diabetes Father   . Stroke Father   . Cancer Maternal Grandmother        ovarian?  Marland Kitchen Hypertension Sister     Social History Social History   Tobacco Use  . Smoking status: Current Every Day Smoker  . Tobacco comment: 5-7 cigarettes a day  Substance Use Topics  . Alcohol use: No  . Drug use: No     Allergies   Patient has no known allergies.   Review of Systems Review of Systems  Constitutional: Negative for fever.  Gastrointestinal: Positive for abdominal pain, anorexia, diarrhea, nausea and vomiting. Negative for constipation.  Genitourinary: Negative for dysuria, frequency and hematuria.  All other systems reviewed and are negative.    Physical Exam Updated Vital Signs There were no vitals taken for this visit.  Physical Exam  Constitutional: She is oriented to person, place, and time. She appears well-developed and well-nourished. No distress.  HENT:  Head: Normocephalic and atraumatic.  Mouth/Throat: Oropharynx is clear and moist.  Eyes: Pupils are equal, round, and reactive to light. Conjunctivae and EOM are normal.  Neck: Normal range of motion. Neck supple.  Cardiovascular: Normal rate, regular rhythm and intact distal pulses.  No murmur heard. Pulmonary/Chest: Effort normal and breath sounds normal. No respiratory distress. She has no wheezes. She has no rales.  Abdominal: Soft. Bowel sounds are normal. She exhibits no distension. There is generalized tenderness. There is no rebound and no guarding.  Musculoskeletal: Normal range of motion. She exhibits no edema or tenderness.  Neurological:  She is alert and oriented to person, place, and time.  Skin: Skin is warm and dry. No rash noted. No erythema.  Psychiatric: She has a normal mood and affect. Her behavior is normal.  Nursing note and vitals reviewed.    ED Treatments / Results  Labs (all labs ordered are listed, but only abnormal results are displayed) Labs Reviewed  COMPREHENSIVE METABOLIC PANEL - Abnormal; Notable for the following components:      Result Value   Glucose, Bld 103 (*)    Calcium 8.7 (*)    Total Protein 6.3 (*)    All other components within normal limits  CBC - Abnormal; Notable for the following components:   WBC 13.6 (*)    All  other components within normal limits  LIPASE, BLOOD  URINALYSIS, ROUTINE W REFLEX MICROSCOPIC    EKG None  Radiology Dg Abdomen 1 View  Result Date: 02/07/2018 CLINICAL DATA:  Generalized abdominal pain, nausea, vomiting and diarrhea. Concern for renal stone. EXAM: ABDOMEN - 1 VIEW COMPARISON:  None. FINDINGS: No radiopaque nephrolithiasis. Stable calcified small venous phleboliths in the left deep pelvis. Mildly dilated small bowel loops throughout the central abdomen up to 3.2 cm diameter. No evidence of pneumatosis or pneumoperitoneum on these supine views. IMPRESSION: No radiopaque nephrolithiasis. Mildly dilated small bowel loops throughout the central abdomen, cannot exclude partial mid to distal small bowel obstruction. CT abdomen/pelvis with oral and IV contrast may be obtained for further evaluation as clinically warranted. These results will be called to the ordering clinician or representative by the Radiology Department at the imaging location. Electronically Signed   By: Ilona Sorrel M.D.   On: 02/07/2018 14:10   Ct Abdomen Pelvis W Contrast  Result Date: 02/07/2018 CLINICAL DATA:  57 year old female with acute abdominal pain, nausea, vomiting and diarrhea for 2 days. EXAM: CT ABDOMEN AND PELVIS WITH CONTRAST TECHNIQUE: Multidetector CT imaging of the  abdomen and pelvis was performed using the standard protocol following bolus administration of intravenous contrast. CONTRAST:  126mL OMNIPAQUE IOHEXOL 300 MG/ML  SOLN COMPARISON:  None. FINDINGS: Lower chest: No acute abnormality Hepatobiliary: The liver and gallbladder are unremarkable. No biliary dilatation. Pancreas: Unremarkable Spleen: Unremarkable Adrenals/Urinary Tract: The kidneys, adrenal glands and bladder are unremarkable. Stomach/Bowel: There is circumferential wall thickening of a long segment of mid small bowel with adjacent inflammation compatible with an enteritis. There is no evidence of bowel obstruction, abscess or pneumoperitoneum. The stomach and appendix are unremarkable. Vascular/Lymphatic: Aortic atherosclerosis. No enlarged abdominal or pelvic lymph nodes. Reproductive: Uterus and bilateral adnexa are unremarkable. Other: A small amount of ascites within the pelvis and adjacent to the spleen noted. Musculoskeletal: No acute or significant osseous findings. IMPRESSION: 1. Circumferential wall thickening and inflammation of mid small bowel compatible with enteritis. No evidence of bowel obstruction, abscess or pneumoperitoneum. Small amount of free fluid which is likely reactive. 2.  Aortic Atherosclerosis (ICD10-I70.0). Electronically Signed   By: Margarette Canada M.D.   On: 02/07/2018 17:55    Procedures Procedures (including critical care time)  Medications Ordered in ED Medications  0.9 %  sodium chloride infusion ( Intravenous New Bag/Given 02/07/18 1722)  ondansetron (ZOFRAN) injection 4 mg (4 mg Intravenous Given 02/07/18 1722)  morphine 4 MG/ML injection 4 mg (4 mg Intravenous Given 02/07/18 1723)  sodium chloride 0.9 % bolus 1,000 mL (1,000 mLs Intravenous New Bag/Given 02/07/18 1721)  iohexol (OMNIPAQUE) 300 MG/ML solution 100 mL (100 mLs Intravenous Contrast Given 02/07/18 1729)     Initial Impression / Assessment and Plan / ED Course  I have reviewed the triage vital signs  and the nursing notes.  Pertinent labs & imaging results that were available during my care of the patient were reviewed by me and considered in my medical decision making (see chart for details).    Patient presenting with diffuse abdominal pain, nausea vomiting and diarrhea.  Seems more classic of food poisoning or viral etiology.  She has diffuse tenderness but no localized pain.  Patient has a mild leukocytosis of 13,000 but normal CMP and lipase.  Patient's UA from urgent care was within normal limits.  Low suspicion for UTI or kidney stone.  Patient had a plain abdominal film that was concerning for possible obstruction.  We will do a CT to further evaluate.  CT was consistent with enteritis.  Patient feeling better after IV fluids and pain control.  Discussed with her eating a bland diet and taking pain and nausea medication PRN.  At this time will hold on doing antibiotics.  No evidence of appendicitis, pancreatitis, cholecystitis, perforation or diverticulitis.  Patient and her husband are in trouble with this plan.  She will follow-up with PCP or GI next week if symptoms have not improved.  Final Clinical Impressions(s) / ED Diagnoses   Final diagnoses:  None    ED Discharge Orders    None       Blanchie Dessert, MD 02/08/18 [phone]

## 2018-02-27 DIAGNOSIS — Z1211 Encounter for screening for malignant neoplasm of colon: Secondary | ICD-10-CM | POA: Diagnosis not present

## 2018-02-27 DIAGNOSIS — K635 Polyp of colon: Secondary | ICD-10-CM | POA: Diagnosis not present

## 2018-02-27 DIAGNOSIS — D127 Benign neoplasm of rectosigmoid junction: Secondary | ICD-10-CM | POA: Diagnosis not present

## 2018-02-27 DIAGNOSIS — K5730 Diverticulosis of large intestine without perforation or abscess without bleeding: Secondary | ICD-10-CM | POA: Diagnosis not present

## 2018-03-27 DIAGNOSIS — B353 Tinea pedis: Secondary | ICD-10-CM | POA: Diagnosis not present

## 2018-04-01 DIAGNOSIS — B351 Tinea unguium: Secondary | ICD-10-CM | POA: Diagnosis not present

## 2018-04-01 DIAGNOSIS — B353 Tinea pedis: Secondary | ICD-10-CM | POA: Diagnosis not present

## 2018-04-01 DIAGNOSIS — L602 Onychogryphosis: Secondary | ICD-10-CM | POA: Diagnosis not present

## 2018-04-02 DIAGNOSIS — Z01818 Encounter for other preprocedural examination: Secondary | ICD-10-CM | POA: Diagnosis not present

## 2018-05-06 DIAGNOSIS — Z01818 Encounter for other preprocedural examination: Secondary | ICD-10-CM | POA: Diagnosis not present

## 2018-05-12 DIAGNOSIS — B353 Tinea pedis: Secondary | ICD-10-CM | POA: Diagnosis not present

## 2018-05-12 DIAGNOSIS — B351 Tinea unguium: Secondary | ICD-10-CM | POA: Diagnosis not present

## 2018-05-21 DIAGNOSIS — Z23 Encounter for immunization: Secondary | ICD-10-CM | POA: Diagnosis not present

## 2018-05-21 DIAGNOSIS — L308 Other specified dermatitis: Secondary | ICD-10-CM | POA: Diagnosis not present

## 2018-05-21 DIAGNOSIS — L2089 Other atopic dermatitis: Secondary | ICD-10-CM | POA: Diagnosis not present

## 2018-06-16 DIAGNOSIS — B353 Tinea pedis: Secondary | ICD-10-CM | POA: Diagnosis not present

## 2018-06-16 DIAGNOSIS — B351 Tinea unguium: Secondary | ICD-10-CM | POA: Diagnosis not present

## 2018-07-01 DIAGNOSIS — K219 Gastro-esophageal reflux disease without esophagitis: Secondary | ICD-10-CM | POA: Diagnosis not present

## 2018-07-01 DIAGNOSIS — E1169 Type 2 diabetes mellitus with other specified complication: Secondary | ICD-10-CM | POA: Diagnosis not present

## 2018-07-01 DIAGNOSIS — E7800 Pure hypercholesterolemia, unspecified: Secondary | ICD-10-CM | POA: Diagnosis not present
